# Patient Record
Sex: Female | Born: 2000 | Race: White | Hispanic: No | Marital: Single | Smoking: Never smoker
Health system: Southern US, Community
[De-identification: ages and names within clinical notes are randomized; demographics above are authoritative.]

## PROBLEM LIST (undated history)

## (undated) DIAGNOSIS — F411 Generalized anxiety disorder: Secondary | ICD-10-CM

## (undated) DIAGNOSIS — G43009 Migraine without aura, not intractable, without status migrainosus: Secondary | ICD-10-CM

## (undated) DIAGNOSIS — F900 Attention-deficit hyperactivity disorder, predominantly inattentive type: Secondary | ICD-10-CM

## (undated) DIAGNOSIS — N921 Excessive and frequent menstruation with irregular cycle: Secondary | ICD-10-CM

## (undated) HISTORY — DX: Migraine without aura, not intractable, without status migrainosus: G43.009

## (undated) HISTORY — DX: Attention-deficit hyperactivity disorder, predominantly inattentive type: F90.0

## (undated) HISTORY — DX: Excessive and frequent menstruation with irregular cycle: N92.1

## (undated) HISTORY — DX: Generalized anxiety disorder: F41.1

---
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
## 2022-09-10 ENCOUNTER — Emergency Department (HOSPITAL_COMMUNITY): Payer: Commercial Managed Care - PPO

## 2022-09-10 ENCOUNTER — Other Ambulatory Visit: Payer: Self-pay

## 2022-09-10 ENCOUNTER — Encounter (HOSPITAL_COMMUNITY): Payer: Self-pay | Admitting: Emergency Medicine

## 2022-09-10 ENCOUNTER — Emergency Department (HOSPITAL_COMMUNITY)
Admission: EM | Admit: 2022-09-10 | Discharge: 2022-09-10 | Disposition: A | Discharge status: Home / Self Care | Payer: Commercial Managed Care - PPO | Attending: Emergency Medicine | Admitting: Emergency Medicine

## 2022-09-10 DIAGNOSIS — R1031 Right lower quadrant pain: Secondary | ICD-10-CM | POA: Insufficient documentation

## 2022-09-10 DIAGNOSIS — R102 Pelvic and perineal pain: Secondary | ICD-10-CM

## 2022-09-10 LAB — COMPREHENSIVE METABOLIC PANEL
ALT: 16 U/L (ref 0–44)
AST: 18 U/L (ref 15–41)
Albumin: 4.2 g/dL (ref 3.5–5.0)
Alkaline Phosphatase: 57 U/L (ref 38–126)
Anion gap: 6 (ref 5–15)
BUN: 11 mg/dL (ref 6–20)
CO2: 23 mmol/L (ref 22–32)
Calcium: 8.9 mg/dL (ref 8.9–10.3)
Chloride: 105 mmol/L (ref 98–111)
Creatinine, Ser: 0.74 mg/dL (ref 0.44–1.00)
GFR, Estimated: >60 mL/min (ref >60)
Glucose, Bld: 112 mg/dL — ABNORMAL HIGH (ref 70–99)
Potassium: 3.8 mmol/L (ref 3.5–5.1)
Sodium: 134 mmol/L — ABNORMAL LOW (ref 135–145)
Total Bilirubin: 2.2 mg/dL — ABNORMAL HIGH (ref 0.3–1.2)
Total Protein: 7.1 g/dL (ref 6.5–8.1)

## 2022-09-10 LAB — CBC WITH DIFFERENTIAL/PLATELET
Abs Immature Granulocytes: 0.01 10*3/uL (ref 0.00–0.07)
Basophils Absolute: 0 10*3/uL (ref 0.0–0.1)
Basophils Relative: 1 %
Eosinophils Absolute: 0.1 10*3/uL (ref 0.0–0.5)
Eosinophils Relative: 2 %
HCT: 40.1 % (ref 36.0–46.0)
Hemoglobin: 13.8 g/dL (ref 12.0–15.0)
Immature Granulocytes: 0 %
Lymphocytes Relative: 30 %
Lymphs Abs: 1.7 10*3/uL (ref 0.7–4.0)
MCH: 31.2 pg (ref 26.0–34.0)
MCHC: 34.4 g/dL (ref 30.0–36.0)
MCV: 90.5 fL (ref 80.0–100.0)
Monocytes Absolute: 0.5 10*3/uL (ref 0.1–1.0)
Monocytes Relative: 9 %
Neutro Abs: 3.3 10*3/uL (ref 1.7–7.7)
Neutrophils Relative %: 58 %
Platelets: 322 10*3/uL (ref 150–400)
RBC: 4.43 MIL/uL (ref 3.87–5.11)
RDW: 11.9 % (ref 11.5–15.5)
WBC: 5.7 10*3/uL (ref 4.0–10.5)
nRBC: 0 % (ref 0.0–0.2)

## 2022-09-10 LAB — I-STAT BETA HCG BLOOD, ED (MC, WL, AP ONLY): I-stat hCG, quantitative: <5 m[IU]/mL (ref <5)

## 2022-09-10 LAB — LIPASE, BLOOD: Lipase: 32 U/L (ref 11–51)

## 2022-09-10 MED ORDER — HYDROMORPHONE HCL 1 MG/ML IJ SOLN
0.5000 mg | Freq: Once | INTRAMUSCULAR | Status: AC
  Administered 2022-09-10: 0.5 mg via INTRAVENOUS
  Filled 2022-09-10: qty 0.5

## 2022-09-10 MED ORDER — CELECOXIB 200 MG PO CAPS: 200.0000 mg | ORAL_CAPSULE | Freq: Two times a day (BID) | ORAL | 0 refills | Status: AC

## 2022-09-10 MED ORDER — ONDANSETRON HCL 4 MG/2ML IJ SOLN
4.0000 mg | Freq: Once | INTRAMUSCULAR | Status: AC
  Administered 2022-09-10: 4 mg via INTRAVENOUS
  Filled 2022-09-10: qty 2

## 2022-09-10 MED ORDER — OXYCODONE-ACETAMINOPHEN 5-325 MG PO TABS: 1.0000 | ORAL_TABLET | Freq: Four times a day (QID) | ORAL | 0 refills | Status: DC | PRN

## 2022-09-10 MED ORDER — ONDANSETRON HCL 4 MG PO TABS: 4.0000 mg | ORAL_TABLET | Freq: Three times a day (TID) | ORAL | 0 refills | Status: DC | PRN

## 2022-09-10 MED ORDER — IOHEXOL 300 MG/ML  SOLN
80.0000 mL | Freq: Once | INTRAMUSCULAR | Status: AC | PRN
  Administered 2022-09-10: 80 mL via INTRAVENOUS

## 2022-09-10 NOTE — ED Notes (Signed)
Pt informed of need for urine specimen  

## 2022-09-10 NOTE — ED Notes (Signed)
Patient transported to Ultrasound 

## 2022-09-10 NOTE — ED Provider Notes (Signed)
[place] EMERGENCY DEPARTMENT AT [facility] Provider Note   CSN: [id number] Arrival date & time: 09/10/22  1530     History {Add pertinent medical, surgical, social history, OB history to HPI:1} Chief Complaint  Patient presents with   Abdominal Pain    Jade Freeman is a 22 y.o. female presents via EMS for severe right lower quadrant abdominal pain.  She has a past medical history of dysmenorrhea, ADHD.  She reports that yesterday she had a little bit of global abdominal pain but was sitting at her desk at work, leaned forward and had sudden onset of severe intense sharp stabbing pain in the right lower quadrant of her abdomen.  She has never had anything like this before.  It does not radiate.  She states that it feels better when she applies pressure deeply to that area and is unbearable when she lets go.  She was nauseous and vomited once prior to arrival but did not receive pain medication with EMS.  EMS also reports that she was febrile to 100.7 prior to arrival.  Patient denies feeling febrile with shaking chills or bodyaches.  She denies urinary symptoms, flank pain, frequency, urgency, vaginal symptoms, discharge or foul odor.     Abdominal Pain      Home Medications Prior to Admission medications   Medication Sig Start Date End Date Taking? Authorizing Provider  atomoxetine (STRATTERA) 25 MG capsule Take 2 capsules (50 mg total) by mouth daily. 05/23/20   Antonietta Barcelona, MD  drospirenone-ethinyl estradiol (YASMIN) 3-0.03 MG tablet Take by mouth. 03/30/18   [provider]  promethazine (PHENERGAN) 25 MG tablet Take 25 mg by mouth every 6 (six) hours as needed. for nausea 10/07/18   [provider]  sertraline (ZOLOFT) 100 MG tablet Take 1 tablet (100 mg total) by mouth daily. 05/23/20   Antonietta Barcelona, MD      Allergies    Patient has no known allergies.    Review of Systems   Review of Systems  Gastrointestinal:  Positive for abdominal pain.     Physical Exam Updated Vital Signs BP (!) 185/117   Pulse (!) 125   Temp 99 F (37.2 C) (Oral)   Resp (!) 23   Ht 5\' 7"  (1.702 m)   Wt 79 kg   SpO2 99%   BMI 27.28 kg/m  Physical Exam Vitals and nursing note reviewed.  Constitutional:      General: She is not in acute distress.    Appearance: She is well-developed. She is not diaphoretic.  HENT:     Head: Normocephalic and atraumatic.     Right Ear: External ear normal.     Left Ear: External ear normal.     Nose: Nose normal.     Mouth/Throat:     Mouth: Mucous membranes are moist.  Eyes:     General: No scleral icterus.    Conjunctiva/sclera: Conjunctivae normal.  Cardiovascular:     Rate and Rhythm: Normal rate and regular rhythm.     Heart sounds: Normal heart sounds. No murmur heard.    No friction rub. No gallop.  Pulmonary:     Effort: Pulmonary effort is normal. No respiratory distress.     Breath sounds: Normal breath sounds.  Abdominal:     General: Bowel sounds are normal. There is no distension.     Palpations: Abdomen is soft. There is no mass.     Tenderness: There is abdominal tenderness in the right lower quadrant.  There is no guarding.  Musculoskeletal:     Cervical back: Normal range of motion.  Skin:    General: Skin is warm and dry.  Neurological:     Mental Status: She is alert and oriented to person, place, and time.  Psychiatric:        Behavior: Behavior normal.     ED Results / Procedures / Treatments   Labs (all labs ordered are listed, but only abnormal results are displayed) Labs Reviewed  CBC WITH DIFFERENTIAL/PLATELET  COMPREHENSIVE METABOLIC PANEL  LIPASE, BLOOD  URINALYSIS, ROUTINE W REFLEX MICROSCOPIC  I-STAT BETA HCG BLOOD, ED ([facility], [facility], [facility] ONLY)    EKG None  Radiology No results found.  Procedures Procedures  {Document cardiac monitor, telemetry assessment procedure when appropriate:1}  Medications Ordered in ED Medications  ondansetron (ZOFRAN) injection 4  mg (4 mg Intravenous Given 09/10/22 1555)  HYDROmorphone (DILAUDID) injection 0.5 mg (0.5 mg Intravenous Given 09/10/22 1556)    ED Course/ Medical Decision Making/ A&P   {   Click here for ABCD2, HEART and other calculatorsREFRESH Note before signing :1}                          Medical Decision Making Amount and/or Complexity of Data Reviewed Labs: ordered.  Risk Prescription drug management.   ***  {Document critical care time when appropriate:1} {Document review of labs and clinical decision tools ie heart score, Chads2Vasc2 etc:1}  {Document your independent review of radiology images, and any outside records:1} {Document your discussion with family members, caretakers, and with consultants:1} {Document social determinants of health affecting pt's care:1} {Document your decision making why or why not admission, treatments were needed:1} Final Clinical Impression(s) / ED Diagnoses Final diagnoses:  None    Rx / DC Orders ED Discharge Orders     None

## 2022-09-10 NOTE — ED Notes (Signed)
Pt states she fell off her horse Sunday

## 2022-09-10 NOTE — Discharge Instructions (Signed)
Get help right away if: You have abdominal or pelvic pain that is severe or gets worse. You cannot eat or drink without vomiting. You suddenly develop a fever or chills. Your menstrual period is much heavier than usual. 

## 2022-09-10 NOTE — ED Notes (Signed)
Pt given water. Pt aware that a urine sample is needed,

## 2022-09-10 NOTE — ED Triage Notes (Signed)
Pt arrived via [facility] c/o R lower abd pain that started last night. Today, while sitting down, she experienced sharp stabbing pain. C/o N/V EMS gave zofran. Ems also states elevated BP and HR. Pt states pain goes away when she applies pressure to the area and comes back when she releases

## 2022-09-11 ENCOUNTER — Telehealth: Payer: Self-pay

## 2022-09-11 MED FILL — Ondansetron HCl Tab 4 MG: ORAL | Qty: 4 | Status: AC

## 2022-09-11 MED FILL — Oxycodone w/ Acetaminophen Tab 5-325 MG: ORAL | Qty: 6 | Status: AC

## 2022-09-11 NOTE — Transitions of Care (Post Inpatient/ED Visit) (Unsigned)
   09/11/2022  Name: Jade Freeman MRN: [id number] DOB: 09-Mar-2001  Today's TOC FU Call Status: Today's TOC FU Call Status:: Unsuccessul Call (1st Attempt) Unsuccessful Call (1st Attempt) Date: 09/11/22  Attempted to reach the patient regarding the most recent Inpatient/ED visit.  Follow Up Plan: Additional outreach attempts will be made to reach the patient to complete the Transitions of Care (Post Inpatient/ED visit) call.   Signature Karena Addison, LPN [facility] Nurse Health Advisor Direct Dial [phone]

## 2022-09-12 NOTE — Transitions of Care (Post Inpatient/ED Visit) (Signed)
   09/12/2022  Name: Oleta Amsler MRN: [id number] DOB: 01/14/2001  Today's TOC FU Call Status: Today's TOC FU Call Status:: Successful TOC FU Call Competed Unsuccessful Call (1st Attempt) Date: 09/11/22 [facility] FU Call Complete Date: 09/12/22  Transition Care Management Follow-up Telephone Call Date of Discharge: 09/10/22 [facility] Type of Discharge: Emergency Department Reason for ED Visit:  (Pelvic and perineal pain) How have you been since you were released from the hospital?: Better Any questions or concerns?: No  Items Reviewed: Did you receive and understand the discharge instructions provided?: Yes Medications obtained,verified, and reconciled?: Yes (Medications Reviewed) Any new allergies since your discharge?: No Dietary orders reviewed?: NA  Medications Reviewed Today: Medications Reviewed Today     Reviewed by Alfonse Spruce, MD (Physician) on 12/14/21 at 1342  Med List Status: <None>   Medication Order Taking? Sig Documenting Provider Last Dose Status Informant  atomoxetine (STRATTERA) 25 MG capsule [id number] Yes Take 2 capsules (50 mg total) by mouth daily. Antonietta Barcelona, MD Taking Active   drospirenone-ethinyl estradiol (YASMIN) 3-0.03 MG tablet [id number] Yes Take by mouth. [provider] Taking Active   promethazine (PHENERGAN) 25 MG tablet [id number] Yes Take 25 mg by mouth every 6 (six) hours as needed. for nausea [provider] Taking Active   sertraline (ZOLOFT) 100 MG tablet [id number] Yes Take 1 tablet (100 mg total) by mouth daily. Antonietta Barcelona, MD Taking Active             Home Care and Equipment/Supplies: Were Home Health Services Ordered?: No Any new equipment or medical supplies ordered?: No  Functional Questionnaire: Do you need assistance with bathing/showering or dressing?: No Do you need assistance with meal preparation?: No Do you need assistance with eating?: No Do you have difficulty maintaining  continence: No Do you need assistance with getting out of bed/getting out of a chair/moving?: No Do you have difficulty managing or taking your medications?: No  Follow up appointments reviewed: PCP Follow-up appointment confirmed?: Yes (PT STATES SHE HAS APPT BUT I DONT SEE IT BUT SHE SAID HER MOM HAS THE INFORMATION) Specialist Hospital Follow-up appointment confirmed?: NA Do you need transportation to your follow-up appointment?: No Do you understand care options if your condition(s) worsen?: Yes-patient verbalized understanding    SIGNATURE TB,CMA

## 2023-04-17 ENCOUNTER — Ambulatory Visit: Payer: Self-pay | Admitting: Emergency Medicine

## 2023-04-17 ENCOUNTER — Ambulatory Visit: Payer: BC Managed Care – PPO | Admitting: Emergency Medicine

## 2023-06-23 ENCOUNTER — Encounter: Payer: Self-pay | Admitting: Emergency Medicine

## 2023-06-23 ENCOUNTER — Ambulatory Visit (INDEPENDENT_AMBULATORY_CARE_PROVIDER_SITE_OTHER): Payer: BC Managed Care – PPO | Admitting: Emergency Medicine

## 2023-06-23 VITALS — BP 130/82 | HR 75 | Temp 98.0°F | Ht 68.25 in | Wt 221.0 lb

## 2023-06-23 DIAGNOSIS — F411 Generalized anxiety disorder: Secondary | ICD-10-CM | POA: Diagnosis not present

## 2023-06-23 DIAGNOSIS — Z7689 Persons encountering health services in other specified circumstances: Secondary | ICD-10-CM

## 2023-06-23 LAB — COMPREHENSIVE METABOLIC PANEL WITH GFR
ALT: 14 U/L (ref 0–35)
AST: 13 U/L (ref 0–37)
Albumin: 4.5 g/dL (ref 3.5–5.2)
Alkaline Phosphatase: 73 U/L (ref 39–117)
BUN: 13 mg/dL (ref 6–23)
CO2: 25 meq/L (ref 19–32)
Calcium: 9.5 mg/dL (ref 8.4–10.5)
Chloride: 105 meq/L (ref 96–112)
Creatinine, Ser: 0.76 mg/dL (ref 0.40–1.20)
GFR: 112.55 mL/min (ref >60.00)
Glucose, Bld: 95 mg/dL (ref 70–99)
Potassium: 3.9 meq/L (ref 3.5–5.1)
Sodium: 139 meq/L (ref 135–145)
Total Bilirubin: 1.1 mg/dL (ref 0.2–1.2)
Total Protein: 7.2 g/dL (ref 6.0–8.3)

## 2023-06-23 LAB — CBC WITH DIFFERENTIAL/PLATELET
Basophils Absolute: 0.1 10*3/uL (ref 0.0–0.1)
Basophils Relative: 0.8 % (ref 0.0–3.0)
Eosinophils Absolute: 0.2 10*3/uL (ref 0.0–0.7)
Eosinophils Relative: 2.8 % (ref 0.0–5.0)
HCT: 40.1 % (ref 36.0–46.0)
Hemoglobin: 13.6 g/dL (ref 12.0–15.0)
Lymphocytes Relative: 30.6 % (ref 12.0–46.0)
Lymphs Abs: 2.6 10*3/uL (ref 0.7–4.0)
MCHC: 33.8 g/dL (ref 30.0–36.0)
MCV: 89.7 fl (ref 78.0–100.0)
Monocytes Absolute: 0.8 10*3/uL (ref 0.1–1.0)
Monocytes Relative: 9.3 % (ref 3.0–12.0)
Neutro Abs: 4.8 10*3/uL (ref 1.4–7.7)
Neutrophils Relative %: 56.5 % (ref 43.0–77.0)
Platelets: 387 10*3/uL (ref 150.0–400.0)
RBC: 4.47 Mil/uL (ref 3.87–5.11)
RDW: 12.8 % (ref 11.5–14.6)
WBC: 8.4 10*3/uL (ref 4.5–10.5)

## 2023-06-23 LAB — LIPID PANEL
Cholesterol: 135 mg/dL (ref 0–200)
HDL: 48.3 mg/dL (ref >39.00)
LDL Cholesterol: 73 mg/dL (ref 0–99)
NonHDL: 87
Total CHOL/HDL Ratio: 3
Triglycerides: 71 mg/dL (ref 0.0–149.0)
VLDL: 14.2 mg/dL (ref 0.0–40.0)

## 2023-06-23 LAB — HEMOGLOBIN A1C: Hgb A1c MFr Bld: 4.9 % (ref 4.6–6.5)

## 2023-06-23 LAB — TSH: TSH: 4 u[IU]/mL (ref 0.35–5.50)

## 2023-06-23 MED ORDER — SERTRALINE HCL 50 MG PO TABS
50.0000 mg | ORAL_TABLET | Freq: Every day | ORAL | 3 refills | Status: AC
Start: 2023-06-23 — End: ?

## 2023-06-23 NOTE — Patient Instructions (Signed)

## 2023-06-23 NOTE — Progress Notes (Signed)
 Jade Freeman 23 y.o.   Chief Complaint  Patient presents with   Establish Care    Patient is here with mother just establish care. She is wanting to get back on her anxiety medication sertraline     HISTORY OF PRESENT ILLNESS: This is a 23 y.o. female first visit to this office, here to establish care with me. Mother is a patient of mine. History of generalized anxiety disorder.  Wants to get back on sertraline. No other  complaints or medical concerns today.  HPI   Prior to Admission medications   Medication Sig Start Date End Date Taking? Authorizing Provider  celecoxib (CELEBREX) 200 MG capsule Take 1 capsule (200 mg total) by mouth 2 (two) times daily. Patient not taking: Reported on 06/23/2023 09/10/22   Arthor Captain, PA-C  sertraline (ZOLOFT) 100 MG tablet Take 1 tablet (100 mg total) by mouth daily. 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 No pertinent surgical history.  Social History   Socioeconomic History   Marital status: Single    Spouse name: Not on file   Number of children: Not on file   Years of education: Not on file   Highest education level: Some college, no degree  Occupational History   Not on file  Tobacco Use   Smoking status: Never    Passive exposure: Never   Smokeless tobacco: Never  Vaping Use   Vaping status: Former  Substance and Sexual Activity   Alcohol use: No   Drug use: No    Sexual activity: Yes    Birth control/protection: Condom  Other Topics Concern   Not on file  Social History Narrative   Caisley is still living with parents   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    Social Drivers of Corporate investment banker Strain: Not on file  Food Insecurity: Not on file  Transportation Needs: Not on file  Physical Activity: Not on file  Stress: Not on file  Social Connections: Not on file  Intimate Partner Violence: Not on file    Family History  Problem Relation Age of Onset   Allergic rhinitis Mother    Asthma Mother    Migraines Mother    Hypertension Father    Allergic rhinitis Father    Asthma Sister    Diabetes Maternal Grandfather    Hypertension Maternal Grandfather    Diabetes Paternal Grandfather    Cancer Paternal Grandfather      Review of Systems  Constitutional: Negative.  Negative for chills and fever.  HENT: Negative.  Negative for congestion and sore throat.   Respiratory: Negative.  Negative for cough and shortness of breath.   Cardiovascular: Negative.  Negative for chest pain and palpitations.  Gastrointestinal:  Negative for abdominal pain, nausea and vomiting.  Skin: Negative.  Negative for rash.  Neurological: Negative.  Negative for dizziness and headaches.  All other systems reviewed and  are negative.   Vitals:   06/23/23 0821  BP: 130/82  Pulse: 75  Temp: 98 F (36.7 C)  SpO2: 99%    Physical Exam Vitals reviewed.  Constitutional:      Appearance: Normal appearance.  HENT:     Head: Normocephalic.     Mouth/Throat:     Mouth: Mucous membranes are moist.     Pharynx: Oropharynx is clear.  Eyes:     Extraocular Movements: Extraocular movements intact.     Pupils: Pupils are equal, round, and reactive to light.  Cardiovascular:     Rate and Rhythm: Normal rate and regular rhythm.     Pulses: Normal pulses.     Heart sounds: Normal heart sounds.  Pulmonary:     Effort: Pulmonary effort is normal.      Breath sounds: Normal breath sounds.  Abdominal:     Palpations: Abdomen is soft.     Tenderness: There is no abdominal tenderness.  Musculoskeletal:     Cervical back: No tenderness.  Lymphadenopathy:     Cervical: No cervical adenopathy.  Skin:    General: Skin is warm and dry.     Capillary Refill: Capillary refill takes less than 2 seconds.  Neurological:     General: No focal deficit present.     Mental Status: She is alert and oriented to person, place, and time.  Psychiatric:        Mood and Affect: Mood normal.        Behavior: Behavior normal.      ASSESSMENT & PLAN: A total of 48 minutes was spent with the patient and counseling/coordination of care regarding preparing for this visit, review of available medical records, establishing care with me, comprehensive history and physical examination, review of any chronic medical conditions and medications, mental health and stress management, education on nutrition, review of health maintenance items, prognosis, documentation and need for follow-up.  Problem List Items Addressed This Visit       Other   Generalized anxiety disorder - Primary   Active and affecting quality of life Recommend to restart Zoloft at 50 mg daily Mental health and stress management discussed May need referral to behavioral health in the near future. Recommend blood work today for screening purposes Diet and nutrition discussed      Relevant Medications   sertraline (ZOLOFT) 50 MG tablet   Other Relevant Orders   Comprehensive metabolic panel with GFR (Completed)   CBC with Differential/Platelet (Completed)   Hemoglobin A1c (Completed)   Lipid panel (Completed)   TSH (Completed)   Other Visit Diagnoses       Encounter to establish care          Patient Instructions  Health Maintenance, Female Adopting a healthy lifestyle and getting preventive care are important in promoting health and wellness. Ask your health care provider  about: The right schedule for you to have regular tests and exams. Things you can do on your own to prevent diseases and keep yourself healthy. What should I know about diet, weight, and exercise? Eat a healthy diet  Eat a diet that includes plenty of vegetables, fruits, low-fat dairy products, and lean protein. Do not eat a lot of foods that are high in solid fats, added sugars, or sodium. Maintain a healthy weight Body mass index (BMI) is used to identify weight problems. It estimates body fat based on height and weight. Your health care provider can help determine your BMI and help you achieve or  maintain a healthy weight. Get regular exercise Get regular exercise. This is one of the most important things you can do for your health. Most adults should: Exercise for at least 150 minutes each week. The exercise should increase your heart rate and make you sweat (moderate-intensity exercise). Do strengthening exercises at least twice a week. This is in addition to the moderate-intensity exercise. Spend less time sitting. Even light physical activity can be beneficial. Watch cholesterol and blood lipids Have your blood tested for lipids and cholesterol at 21 years of age, then have this test every 5 years. Have your cholesterol levels checked more often if: Your lipid or cholesterol levels are high. You are older than 21 years of age. You are at high risk for heart disease. What should I know about cancer screening? Depending on your health history and family history, you may need to have cancer screening at various ages. This may include screening for: Breast cancer. Cervical cancer. Colorectal cancer. Skin cancer. Lung cancer. What should I know about heart disease, diabetes, and high blood pressure? Blood pressure and heart disease High blood pressure causes heart disease and increases the risk of stroke. This is more likely to develop in people who have high blood pressure readings  or are overweight. Have your blood pressure checked: Every 3-5 years if you are 52-30 years of age. Every year if you are 8 years old or older. Diabetes Have regular diabetes screenings. This checks your fasting blood sugar level. Have the screening done: Once every three years after age 41 if you are at a normal weight and have a low risk for diabetes. More often and at a younger age if you are overweight or have a high risk for diabetes. What should I know about preventing infection? Hepatitis B If you have a higher risk for hepatitis B, you should be screened for this virus. Talk with your health care provider to find out if you are at risk for hepatitis B infection. Hepatitis C Testing is recommended for: Everyone born from [birth year] through 1963. Anyone with known risk factors for hepatitis C. Sexually transmitted infections (STIs) Get screened for STIs, including gonorrhea and chlamydia, if: You are sexually active and are younger than 21 years of age. You are older than 21 years of age and your health care provider tells you that you are at risk for this type of infection. Your sexual activity has changed since you were last screened, and you are at increased risk for chlamydia or gonorrhea. Ask your health care provider if you are at risk. Ask your health care provider about whether you are at high risk for HIV. Your health care provider may recommend a prescription medicine to help prevent HIV infection. If you choose to take medicine to prevent HIV, you should first get tested for HIV. You should then be tested every 3 months for as long as you are taking the medicine. Pregnancy If you are about to stop having your period (premenopausal) and you may become pregnant, seek counseling before you get pregnant. Take 400 to 800 micrograms (mcg) of folic acid every day if you become pregnant. Ask for birth control (contraception) if you want to prevent pregnancy. Osteoporosis and  menopause Osteoporosis is a disease in which the bones lose minerals and strength with aging. This can result in bone fractures. If you are 55 years old or older, or if you are at risk for osteoporosis and fractures, ask your health care provider if you should:  Be screened for bone loss. Take a calcium or vitamin D supplement to lower your risk of fractures. Be given hormone replacement therapy (HRT) to treat symptoms of menopause. Follow these instructions at home: Alcohol use Do not drink alcohol if: Your health care provider tells you not to drink. You are pregnant, may be pregnant, or are planning to become pregnant. If you drink alcohol: Limit how much you have to: 0-1 drink a day. Know how much alcohol is in your drink. In the [place] one drink equals one 12 oz bottle of beer (355 mL), one 5 oz glass of wine (148 mL), or one 1 oz glass of hard liquor (44 mL). Lifestyle Do not use any products that contain nicotine or tobacco. These products include cigarettes, chewing tobacco, and vaping devices, such as e-cigarettes. If you need help quitting, ask your health care provider. Do not use street drugs. Do not share needles. Ask your health care provider for help if you need support or information about quitting drugs. General instructions Schedule regular health, dental, and eye exams. Stay current with your vaccines. Tell your health care provider if: You often feel depressed. You have ever been abused or do not feel safe at home. Summary Adopting a healthy lifestyle and getting preventive care are important in promoting health and wellness. Follow your health care provider's instructions about healthy diet, exercising, and getting tested or screened for diseases. Follow your health care provider's instructions on monitoring your cholesterol and blood pressure. This information is not intended to replace advice given to you by your health care provider. Make sure you discuss any  questions you have with your health care provider. Document Revised: 07/31/2020 Document Reviewed: 07/31/2020 Elsevier Patient Education  2024 Elsevier Inc.     Edwina Barth, MD [facility]

## 2023-06-23 NOTE — Assessment & Plan Note (Signed)
 Active and affecting quality of life Recommend to restart Zoloft at 50 mg daily Mental health and stress management discussed May need referral to behavioral health in the near future. Recommend blood work today for screening purposes Diet and nutrition discussed

## 2023-07-01 NOTE — Progress Notes (Signed)
Patient seen results on Mychart.

## 2023-12-23 ENCOUNTER — Ambulatory Visit: Admitting: Emergency Medicine

## 2023-12-24 ENCOUNTER — Other Ambulatory Visit: Payer: Self-pay

## 2023-12-24 ENCOUNTER — Encounter (HOSPITAL_COMMUNITY): Payer: Self-pay

## 2023-12-24 ENCOUNTER — Emergency Department (HOSPITAL_COMMUNITY)
Admission: EM | Admit: 2023-12-24 | Discharge: 2023-12-24 | Disposition: A | Discharge status: Home / Self Care | Attending: Emergency Medicine | Admitting: Emergency Medicine

## 2023-12-24 DIAGNOSIS — N939 Abnormal uterine and vaginal bleeding, unspecified: Secondary | ICD-10-CM | POA: Insufficient documentation

## 2023-12-24 LAB — CBC WITH DIFFERENTIAL/PLATELET
Abs Immature Granulocytes: 0.01 K/uL (ref 0.00–0.07)
Basophils Absolute: 0 K/uL (ref 0.0–0.1)
Basophils Relative: 0 %
Eosinophils Absolute: 0.2 K/uL (ref 0.0–0.5)
Eosinophils Relative: 3 %
HCT: 38.1 % (ref 36.0–46.0)
Hemoglobin: 13 g/dL (ref 12.0–15.0)
Immature Granulocytes: 0 %
Lymphocytes Relative: 31 %
Lymphs Abs: 1.9 K/uL (ref 0.7–4.0)
MCH: 30.8 pg (ref 26.0–34.0)
MCHC: 34.1 g/dL (ref 30.0–36.0)
MCV: 90.3 fL (ref 80.0–100.0)
Monocytes Absolute: 0.6 K/uL (ref 0.1–1.0)
Monocytes Relative: 9 %
Neutro Abs: 3.6 K/uL (ref 1.7–7.7)
Neutrophils Relative %: 57 %
Platelets: 337 K/uL (ref 150–400)
RBC: 4.22 MIL/uL (ref 3.87–5.11)
RDW: 12.3 % (ref 11.5–15.5)
WBC: 6.3 K/uL (ref 4.0–10.5)
nRBC: 0 % (ref 0.0–0.2)

## 2023-12-24 LAB — URINALYSIS, ROUTINE W REFLEX MICROSCOPIC
Bilirubin Urine: NEGATIVE
Glucose, UA: NEGATIVE mg/dL
Ketones, ur: NEGATIVE mg/dL
Leukocytes,Ua: NEGATIVE
Nitrite: NEGATIVE
Protein, ur: NEGATIVE mg/dL
RBC / HPF: >50 RBC/hpf (ref 0–5)
Specific Gravity, Urine: 1.012 (ref 1.005–1.030)
pH: 7 (ref 5.0–8.0)

## 2023-12-24 LAB — COMPREHENSIVE METABOLIC PANEL WITH GFR
ALT: 12 U/L (ref 0–44)
AST: 15 U/L (ref 15–41)
Albumin: 4.4 g/dL (ref 3.5–5.0)
Alkaline Phosphatase: 70 U/L (ref 38–126)
Anion gap: 10 (ref 5–15)
BUN: 11 mg/dL (ref 6–20)
CO2: 24 mmol/L (ref 22–32)
Calcium: 9.5 mg/dL (ref 8.9–10.3)
Chloride: 106 mmol/L (ref 98–111)
Creatinine, Ser: 0.93 mg/dL (ref 0.44–1.00)
GFR, Estimated: >60 mL/min (ref >60)
Glucose, Bld: 86 mg/dL (ref 70–99)
Potassium: 4.2 mmol/L (ref 3.5–5.1)
Sodium: 140 mmol/L (ref 135–145)
Total Bilirubin: 0.9 mg/dL (ref 0.0–1.2)
Total Protein: 6.7 g/dL (ref 6.5–8.1)

## 2023-12-24 LAB — PREGNANCY, URINE: Preg Test, Ur: NEGATIVE

## 2023-12-24 MED ORDER — ACETAMINOPHEN 500 MG PO TABS
1000.0000 mg | ORAL_TABLET | Freq: Once | ORAL | Status: AC
  Administered 2023-12-24: 1000 mg via ORAL
  Filled 2023-12-24: qty 2

## 2023-12-24 NOTE — Discharge Instructions (Addendum)
 It was a pleasure taking care of you today.  Based on your history, physical exam, labs, and pelvic exam I feel you are safe for discharge.  Today your workup was overall reassuring.  Since the heavy bleeding has only been going on for 1 day I do not see any need to change your medicines at this point.  Your hemoglobin today was stable.  I do recommend that you continue to monitor your symptoms and if heavy bleeding continues you should reach out to your OB/GYN for their closest appointment.  If symptoms persist or worsen recommend follow-up with OB/GYN within 48 hours.  If you experience any of the following symptoms including but not limited to fever, chills, severe abdominal pain, continued excessive bleeding, nausea/vomiting, weakness, dizziness, or other concerning symptom please return to the emergency department or seek further medical care.  Recommend that you continue to take over-the-counter Tylenol  and ibuprofen  for help with abdominal cramps with your period.  Please do not exceed the daily limit of 4000 mg of Tylenol  per day or the max dose of 1000 mg at a single dose, please do not exceed the max dose of ibuprofen  which is 6 to 800 mg per dose.

## 2023-12-24 NOTE — ED Provider Notes (Incomplete)
 [place] EMERGENCY DEPARTMENT AT [facility] Provider Note   CSN: [id number] Arrival date & time: 12/24/23  1935     Patient presents with: Vaginal Bleeding   Jade Freeman is a 23 y.o. female who presents emergency department with a chief complaint of vaginal bleeding.  Patient started her menstrual cycle yesterday and states that it was normal however when she woke up at 3:30 PM this evening because she works night shift she noted that she was going through about a super plus tampon an hour and was passing large clots that were as big as the palm of her hand.  Patient states the only time she has ever passed clots like this was when she miscarried earlier this year.  Patient states she is on pill birth control however has had some missed doses because she is not good at taking them.  She does also have a history of ovarian cysts and was being worked up for endometriosis however her OB/GYN states that she currently does not meet criteria.  Denies fever, chills, chest pain, shortness of breath.  Patient does appreciate some mild abdominal pain over her pelvic area that she states is consistent with pelvic cramping due to cycle.  {Add pertinent medical, surgical, social history, OB history to YEP:67052}  Vaginal Bleeding      Prior to Admission medications   Medication Sig Start Date End Date Taking? Authorizing Provider  celecoxib  (CELEBREX ) 200 MG capsule Take 1 capsule (200 mg total) by mouth 2 (two) times daily. Patient not taking: Reported on 06/23/2023 09/10/22   Harris, Abigail, PA-C  sertraline  (ZOLOFT ) 50 MG tablet Take 1 tablet (50 mg total) by mouth daily. 06/23/23   Purcell Emil Schanz, MD    Allergies: Strawberry extract    Review of Systems  Genitourinary:  Positive for vaginal bleeding.    Updated Vital Signs BP (!) 168/96 (BP Location: Right Arm)   Pulse 89   Temp 99.3 F (37.4 C) (Oral)   Resp 18   Ht 5' 8 (1.727 m)   Wt 100.2 kg   LMP 12/23/2023  (Exact Date)   SpO2 99%   BMI 33.59 kg/m   Physical Exam Vitals and nursing note reviewed.  Constitutional:      General: She is awake. She is not in acute distress.    Appearance: Normal appearance. She is not ill-appearing, toxic-appearing or diaphoretic.  HENT:     Head: Normocephalic and atraumatic.  Eyes:     General: No scleral icterus. Cardiovascular:     Rate and Rhythm: Normal rate and regular rhythm.  Pulmonary:     Effort: Pulmonary effort is normal. No respiratory distress.     Breath sounds: No wheezing, rhonchi or rales.  Abdominal:     General: Abdomen is flat. There is no distension.     Tenderness: There is no abdominal tenderness. There is no right CVA tenderness, left CVA tenderness or guarding.  Musculoskeletal:        General: Normal range of motion.  Skin:    General: Skin is warm.     Capillary Refill: Capillary refill takes less than 2 seconds.  Neurological:     General: No focal deficit present.     Mental Status: She is alert and oriented to person, place, and time.  Psychiatric:        Mood and Affect: Mood normal.        Behavior: Behavior normal. Behavior is cooperative.     (all labs  ordered are listed, but only abnormal results are displayed) Labs Reviewed  CBC WITH DIFFERENTIAL/PLATELET  COMPREHENSIVE METABOLIC PANEL WITH GFR  URINALYSIS, ROUTINE W REFLEX MICROSCOPIC  PREGNANCY, URINE    EKG: None  Radiology: No results found.  {Document cardiac monitor, telemetry assessment procedure when appropriate:32947} Procedures   Medications Ordered in the ED  acetaminophen  (TYLENOL ) tablet 1,000 mg (has no administration in time range)      {Click here for ABCD2, HEART and other calculators REFRESH Note before signing:1}                              Medical Decision Making Amount and/or Complexity of Data Reviewed Labs: ordered.  Risk OTC drugs.     Patient presents to the ED for concern of ***, this involves an extensive  number of treatment options, and is a complaint that carries with it a high risk of complications and morbidity.  The differential diagnosis includes ***   Co morbidities that complicate the patient evaluation  ***   Additional history obtained:  Additional history obtained from *** {Blank multiple:19196::EMS,Family,Nursing,Outside Medical Records,Past Admission}   External records from outside source obtained and reviewed including ***   Lab Tests:  I Ordered, and personally interpreted labs.  The pertinent results include:  ***   Imaging Studies ordered:  I ordered imaging studies including ***  I independently visualized and interpreted imaging which showed *** I agree with the radiologist interpretation   Cardiac Monitoring:  The patient was maintained on a cardiac monitor.  I personally viewed and interpreted the cardiac monitored which showed an underlying rhythm of: ***   Medicines ordered and prescription drug management:  I ordered medication including ***  for ***  Reevaluation of the patient after these medicines showed that the patient {resolved/improved/worsened:23923::improved} I have reviewed the patients home medicines and have made adjustments as needed   Test Considered:  ***   Critical Interventions:  ***   Consultations Obtained:  I requested consultation with the ***,  and discussed lab and imaging findings as well as pertinent plan - they recommend: ***   Problem List / ED Course:  -23 year old female presents emergency department the chief complaint of vaginal bleeding, patient started her cycle yesterday and is now going through a super plus tampon per hour and is passing large clots - General Lab work ordered, patient noted to be not anemic and pregnancy is negative   Reevaluation:  After the interventions noted above, I reevaluated the patient and found that they have  :{resolved/improved/worsened:23923::improved}   Social Determinants of Health:  ***   Dispostion:  After consideration of the diagnostic results and the patients response to treatment, I feel that the patent would benefit from ***.    {Document critical care time when appropriate  Document review of labs and clinical decision tools ie CHADS2VASC2, etc  Document your independent review of radiology images and any outside records  Document your discussion with family members, caretakers and with consultants  Document social determinants of health affecting pt's care  Document your decision making why or why not admission, treatments were needed:32947:::1}   Final diagnoses:  None    ED Discharge Orders     None

## 2023-12-24 NOTE — ED Provider Notes (Signed)
 [place] EMERGENCY DEPARTMENT AT [facility] Provider Note   CSN: [id number] Arrival date & time: 12/24/23  1935     Patient presents with: Vaginal Bleeding   Jade Freeman is a 23 y.o. female who presents to the  emergency department with a chief complaint of vaginal bleeding.  Patient started her menstrual cycle yesterday and states that it was normal however when she woke up at 3:30 PM this evening because she works night shift she noted that she was going through about a super plus tampon an hour and was passing large clots that were as big as the palm of her hand.  Patient states the only time she has ever passed clots like this was when she miscarried earlier this year.  Patient states she is on pill birth control however has had some missed doses because she is not good at taking them.  She does also have a history of ovarian cysts and was being worked up for endometriosis however her OB/GYN states that she currently does not meet criteria.  Denies fever, chills, chest pain, shortness of breath.  Patient does appreciate some mild abdominal pain over her pelvic area that she states is consistent with pelvic cramping due to cycle.  Past medical history significant for generalized anxiety disorder, ADHD, miscarriage, etc.    Vaginal Bleeding      Prior to Admission medications   Medication Sig Start Date End Date Taking? Authorizing Provider  celecoxib  (CELEBREX ) 200 MG capsule Take 1 capsule (200 mg total) by mouth 2 (two) times daily. Patient not taking: Reported on 06/23/2023 09/10/22   Harris, Abigail, PA-C  sertraline  (ZOLOFT ) 50 MG tablet Take 1 tablet (50 mg total) by mouth daily. 06/23/23   Purcell Emil Schanz, MD    Allergies: Strawberry extract    Review of Systems  Genitourinary:  Positive for vaginal bleeding.    Updated Vital Signs BP (!) 168/96 (BP Location: Right Arm)   Pulse 89   Temp 99.3 F (37.4 C) (Oral)   Resp 18   Ht 5' 8 (1.727 m)   Wt  100.2 kg   LMP 12/23/2023 (Exact Date)   SpO2 99%   BMI 33.59 kg/m   Physical Exam Vitals and nursing note reviewed. Exam conducted with a chaperone present.  Constitutional:      General: She is awake. She is not in acute distress.    Appearance: Normal appearance. She is not ill-appearing, toxic-appearing or diaphoretic.  HENT:     Head: Normocephalic and atraumatic.  Eyes:     General: No scleral icterus. Cardiovascular:     Rate and Rhythm: Normal rate and regular rhythm.  Pulmonary:     Effort: Pulmonary effort is normal. No respiratory distress.     Breath sounds: No wheezing, rhonchi or rales.  Abdominal:     General: Abdomen is flat. There is no distension.     Tenderness: There is no abdominal tenderness. There is no right CVA tenderness, left CVA tenderness or guarding.  Genitourinary:    Comments: Exam completed by myself, there is bright red blood in vaginal vault however no active hemorrhage or signs of trauma/injury appreciated, cervix visualized and anatomy appears normal, no bimanual tenderness with cervix or ovaries Musculoskeletal:        General: Normal range of motion.  Skin:    General: Skin is warm.     Capillary Refill: Capillary refill takes less than 2 seconds.  Neurological:     General: No focal  deficit present.     Mental Status: She is alert and oriented to person, place, and time.  Psychiatric:        Mood and Affect: Mood normal.        Behavior: Behavior normal. Behavior is cooperative.     (all labs ordered are listed, but only abnormal results are displayed) Labs Reviewed  URINALYSIS, ROUTINE W REFLEX MICROSCOPIC - Abnormal; Notable for the following components:      Result Value   Hgb urine dipstick LARGE (*)    Bacteria, UA RARE (*)    All other components within normal limits  CBC WITH DIFFERENTIAL/PLATELET  COMPREHENSIVE METABOLIC PANEL WITH GFR  PREGNANCY, URINE    EKG: None  Radiology: No results found.   .Pelvic  exam  Date/Time: 12/25/2023 1:04 AM  Performed by: Janetta Terrall FALCON, PA-C Authorized by: Janetta Terrall FALCON, PA-C  Consent: Verbal consent obtained Consent given by: patient Patient understanding: patient states understanding of the procedure being performed Patient identity confirmed: verbally with patient and arm band Patient tolerance: patient tolerated the procedure well with no immediate complications      Medications Ordered in the ED  acetaminophen  (TYLENOL ) tablet 1,000 mg (1,000 mg Oral Given 12/24/23 2202)                                    Medical Decision Making Amount and/or Complexity of Data Reviewed Labs: ordered.  Risk OTC drugs.   Patient presents to the ED for concern of excessive vaginal bleeding, this involves an extensive number of treatment options, and is a complaint that carries with it a high risk of complications and morbidity.  The differential diagnosis includes ectopic pregnancy, fibroids, carriage, trauma/injury, hemorrhage, etc.   Co morbidities that complicate the patient evaluation  generalized anxiety disorder, ADHD, miscarriage   Lab Tests:  I Ordered, and personally interpreted labs.  The pertinent results include: CBC reassuring with hemoglobin of 13, CMP also reassuring, urinalysis shows large hemoglobin and rare bacteria, patient denies urinary symptoms at this time, pregnancy negative   Medicines ordered and prescription drug management:  I ordered medication including tylenol  for abdominal pain Reevaluation of the patient after these medicines showed that the patient improved I have reviewed the patients home medicines and have made adjustments as needed   Test Considered:  Ultrasound of pelvic organs: Declined at this time as patient is pregnancy negative, hemoglobin is stable, I see no clinical indication for imaging here in the emergency department today, patient is already established with an outpatient  OB/GYN   Critical Interventions:  None   Problem List / ED Course:  -23 year old female presents to the emergency department with chief complaint of vaginal bleeding, patient started her cycle yesterday and is now going through a super plus tampon per hour and is passing large clots, denies blood thinner  - General Lab work ordered, patient noted to not be anemic and pregnancy is negative - Pelvic exam completed by myself and no signs of active hemorrhage or trauma/injury, no tenderness with bimanual examination, patient denied concern for STDs so no samples taken today - Based off of reassuring history, physical exam, and labs I feel patient is stable for discharge with close OB/GYN follow-up, patient is hemodynamically stable and pregnancy is negative so no concern for ectopic pregnancy at this time, supportive care for abdominal pain most likely related to menstrual cycle recommended - Return precautions  given - Patient discharged - Most likely diagnosis at this time is excessive bleeding related to menstrual cycle, no need for admission at this time, no need for further imaging or workup in the ED   Reevaluation:  After the interventions noted above, I reevaluated the patient and found that they have :improved   Social Determinants of Health:  none   Dispostion:  After consideration of the diagnostic results and the patients response to treatment, I feel that the patient would benefit from discharge and outpatient therapy as described, follow-up with OB/GYN if symptoms persist or worsen, preferably within 2 to 3 days..       Final diagnoses:  Vaginal bleeding    ED Discharge Orders     None          Janetta Terrall JULIANNA DEVONNA 12/25/23 CATHRINE Dean Clarity, MD 12/27/23 [phone]

## 2023-12-24 NOTE — ED Triage Notes (Signed)
 Pt to ED from home with c/o heavy bleeding with clots, pt says she is on menstrual cycle, but never bleeds this heavy and never has clots, pt reports hx of cysts on ovaries. Pt says she has went through 3 super tampon and and pad in the past 4 hours
